# Patient Record
Sex: Female | Born: 1985 | Race: Black or African American | Hispanic: No | Marital: Single | Smoking: Never smoker
Health system: Southern US, Community
[De-identification: ages and names within clinical notes are randomized; demographics above are authoritative.]

## PROBLEM LIST (undated history)

## (undated) DIAGNOSIS — M1990 Unspecified osteoarthritis, unspecified site: Secondary | ICD-10-CM

---

## 2016-02-10 ENCOUNTER — Emergency Department (HOSPITAL_COMMUNITY): Payer: Self-pay

## 2016-02-10 ENCOUNTER — Encounter (HOSPITAL_COMMUNITY): Payer: Self-pay | Admitting: Oncology

## 2016-02-10 DIAGNOSIS — F1290 Cannabis use, unspecified, uncomplicated: Secondary | ICD-10-CM | POA: Insufficient documentation

## 2016-02-10 DIAGNOSIS — M25462 Effusion, left knee: Secondary | ICD-10-CM | POA: Insufficient documentation

## 2016-02-10 DIAGNOSIS — Z791 Long term (current) use of non-steroidal anti-inflammatories (NSAID): Secondary | ICD-10-CM | POA: Insufficient documentation

## 2016-02-10 MED ORDER — OXYCODONE-ACETAMINOPHEN 5-325 MG PO TABS
1.0000 | ORAL_TABLET | ORAL | Status: DC | PRN
Start: 2016-02-10 — End: 2016-02-11
  Administered 2016-02-10: 1 via ORAL
  Filled 2016-02-10: qty 1

## 2016-02-10 NOTE — ED Triage Notes (Signed)
Pt c/o left knee pain.  Denies trauma or injury to left knee.  Pt states this has happened in the past.  Rates pain 10/10.  Pt is A&O x 4.

## 2016-02-11 ENCOUNTER — Emergency Department (HOSPITAL_COMMUNITY)
Admission: EM | Admit: 2016-02-11 | Discharge: 2016-02-11 | Disposition: A | Discharge status: Home / Self Care | Payer: Self-pay | Attending: Emergency Medicine | Admitting: Emergency Medicine

## 2016-02-11 DIAGNOSIS — M25562 Pain in left knee: Secondary | ICD-10-CM

## 2016-02-11 DIAGNOSIS — M25462 Effusion, left knee: Secondary | ICD-10-CM

## 2016-02-11 HISTORY — DX: Unspecified osteoarthritis, unspecified site: M19.90

## 2016-02-11 LAB — SYNOVIAL CELL COUNT + DIFF, W/ CRYSTALS
CRYSTALS FLUID: NONE SEEN
Lymphocytes-Synovial Fld: 10 % (ref 0–20)
Monocyte-Macrophage-Synovial Fluid: 3 % — ABNORMAL LOW (ref 50–90)
NEUTROPHIL, SYNOVIAL: 87 % — AB (ref 0–25)
WBC, Synovial: 20400 /mm3 — ABNORMAL HIGH (ref 0–200)

## 2016-02-11 LAB — GRAM STAIN

## 2016-02-11 MED ORDER — LIDOCAINE-EPINEPHRINE (PF) 2 %-1:200000 IJ SOLN
20.0000 mL | Freq: Once | INTRAMUSCULAR | Status: AC
  Administered 2016-02-11: 20 mL via INTRADERMAL

## 2016-02-11 MED ORDER — LIDOCAINE-EPINEPHRINE (PF) 1 %-1:200000 IJ SOLN
INTRAMUSCULAR | Status: AC
  Filled 2016-02-11: qty 30

## 2016-02-11 MED ORDER — NAPROXEN 500 MG PO TABS: ORAL_TABLET | ORAL | 0 refills | Status: AC

## 2016-02-11 MED ORDER — NAPROXEN 500 MG PO TABS
500.0000 mg | ORAL_TABLET | Freq: Once | ORAL | Status: AC
  Administered 2016-02-11: 500 mg via ORAL
  Filled 2016-02-11: qty 1

## 2016-02-11 NOTE — ED Notes (Signed)
Patient is alert and oriented x3.  She was given DC instructions and follow up visit instructions.  Patient gave verbal understanding. She was DC ambulatory under her own power to home.  V/S stable.  He was not showing any signs of distress on DC 

## 2016-02-11 NOTE — ED Provider Notes (Signed)
[facility]-EMERGENCY DEPT Provider Note: Kristina Santana. Lane Gaelen Brager, MD, FACEP  CSN: [id number] MRN: [id number] ARRIVAL: 02/10/16 at 2244  By signing my name below, I, Kristina Santana, attest that this documentation has been prepared under the direction and in the presence of Paula LibraJohn Valeen Borys, MD. Electronically Signed: Bridgette HabermannMaria Santana, ED Scribe. 02/11/16. 12:49 AM. CHIEF COMPLAINT  Knee Pain   HISTORY OF PRESENT ILLNESS  HPI Comments: Kristina Santana is a 30 y.o. female with h/o arthritis who presents to the Emergency Department complaining of 10/10, aching and throbbing left knee pain onset 3 days ago with associated swelling but no redness or warmth. Pt denies any trauma or injury to her knee. Pain is exacerbated with movement. No alleviating factors noted. Pt states she had the same symptoms 4 months ago which resolved on its own. Pt denies fever.   Past Medical History:  Diagnosis Date  . Arthritis     History reviewed. No pertinent surgical history.  No family history on file.  Social History  Substance Use Topics  . Smoking status: Never Smoker  . Smokeless tobacco: Never Used  . Alcohol use Yes     Comment: socially    Prior to Admission medications   Medication Sig Start Date End Date Taking? Authorizing Provider  naproxen (NAPROSYN) 500 MG tablet Take one tablet every 12 hours as needed for knee pain. Best taken with a meal. 02/11/16   Paula LibraJohn Corbyn Steedman, MD    Allergies Review of patient's allergies indicates no known allergies.   REVIEW OF SYSTEMS  Negative except as noted here or in the History of Present Illness.   PHYSICAL EXAMINATION  Initial Vital Signs Blood pressure 140/90, pulse 116, temperature 98.6 F (37 C), temperature source Oral, resp. rate 20, height 6\' 2"  (1.88 m), weight 270 lb (122.5 kg), last menstrual period 01/23/2016, SpO2 100 %.  Examination General: Well-developed, well-nourished female in no acute distress; appearance consistent with age of record HENT: normocephalic;  atraumatic Eyes: pupils equal, round and reactive to light; extraocular muscles intact Neck: supple Heart: regular rate and rhythm Lungs: clear to auscultation bilaterally Abdomen: soft; nondistended; nontender Extremities: No deformity; full range of motion except left knee limited by pain; left knee tenderness with effusion but no erythema or warmth; pulses normal Neurologic: Awake, alert and oriented; motor function intact in all extremities and symmetric; no facial droop Skin: Warm and dry Psychiatric: Normal mood and affect   RESULTS  Summary of this visit's results, reviewed by myself:   EKG Interpretation  Date/Time:    Ventricular Rate:    PR Interval:    QRS Duration:   QT Interval:    QTC Calculation:   R Axis:     Text Interpretation:        Laboratory Studies: Results for orders placed or performed during the hospital encounter of 02/11/16 (from the past 24 hour(s))  Body fluid culture     Status: None (Preliminary result)   Collection Time: 02/11/16 12:55 AM  Result Value Ref Range   Specimen Description SYNOVIAL KNEE LEFT    Special Requests NONE    Gram Stain      ABUNDANT WBC PRESENT, PREDOMINANTLY PMN NO ORGANISMS SEEN Gram Stain Report Called to,Read Back By and Verified With: T.LEONARD,RN  AT 0225 02/11/16 BY W.SHEA    Culture PENDING    Report Status PENDING   Synovial cell count + diff, w/ crystals     Status: Abnormal   Collection Time: 02/11/16 12:55 AM  Result Value Ref  Range   Color, Synovial YELLOW YELLOW   Appearance-Synovial TURBID (A) CLEAR   Crystals, Fluid NO CRYSTALS SEEN    WBC, Synovial 20,400 (H) 0 - 200 /cu mm   Neutrophil, Synovial 87 (H) 0 - 25 %   Lymphocytes-Synovial Fld 10 0 - 20 %   Monocyte-Macrophage-Synovial Fluid 3 (L) 50 - 90 %   Imaging Studies: Dg Knee Complete 4 Views Left  Result Date: 02/11/2016 CLINICAL DATA:  Acute onset of generalized left knee pain. Initial encounter. EXAM: LEFT KNEE - COMPLETE 4+ VIEW  COMPARISON:  None. FINDINGS: There is no evidence of fracture or dislocation. The joint spaces are preserved. No significant degenerative change is seen; the patellofemoral joint is grossly unremarkable in appearance. A large knee joint effusion is noted. The soft tissues are otherwise grossly unremarkable in appearance. IMPRESSION: 1. No evidence of fracture or dislocation. 2. Large knee joint effusion noted. MRI could be considered for further evaluation, to assess for internal derangement. Electronically Signed   By: Roanna Raider M.D.   On: 02/11/2016 00:10    ED COURSE  Nursing notes and initial vitals signs, including pulse oximetry, reviewed.  Vitals:   02/10/16 2314 02/10/16 2317 02/11/16 0305  BP: 140/90  126/72  Pulse: 116  74  Resp: 20  20  Temp: 98.6 F (37 C)    TempSrc: Oral    SpO2: 100%  100%  Weight:  270 lb (122.5 kg)   Height:  6\' 2"  (1.88 m)    3:07 AM Patient's pain is significantly improved. She was able to ambulate to the bathroom with minimal pain. We will start on an anti-inflammatory and have her follow-up with orthopedics if symptoms persist. Symptoms and synovial fluid white count are not consistent with a septic joint but fluid has been sent for culture.  PROCEDURES   ARTHROCENTESIS Skin overlying the medial aspect of the left knee joint was anesthetized with about 1.5 milliliters of 2% lidocaine with epinephrine. The knee was then prepped and draped in the usual sterile fashion. An 18-gauge needle was entered into the left knee joint and approximately 60 milliliters of grossly cloudy, straw-colored synovial fluid were obtained. The patient tolerated this well and there were no immediate complications. Fluid was sent to the lab for analysis.  ED DIAGNOSES     ICD-9-CM ICD-10-CM   1. Acute pain of left knee 719.46 M25.562   2. Effusion of left knee 719.06 M25.462     I personally performed the services described in this documentation, which was scribed in  my presence. The recorded information has been reviewed and is accurate.     Paula Libra, MD 02/11/16 [phone]

## 2016-02-16 LAB — CULTURE, BODY FLUID W GRAM STAIN -BOTTLE

## 2016-02-16 LAB — CULTURE, BODY FLUID-BOTTLE: CULTURE: NO GROWTH

## 2018-06-13 IMAGING — CR DG KNEE COMPLETE 4+V*L*
4 series · 4 of 4 positions shown · non-contrast
Comparison: None.

CLINICAL DATA: Acute onset of generalized left knee pain. Initial
encounter.

EXAM:
LEFT KNEE - COMPLETE 4+ VIEW

[t knee ap left]
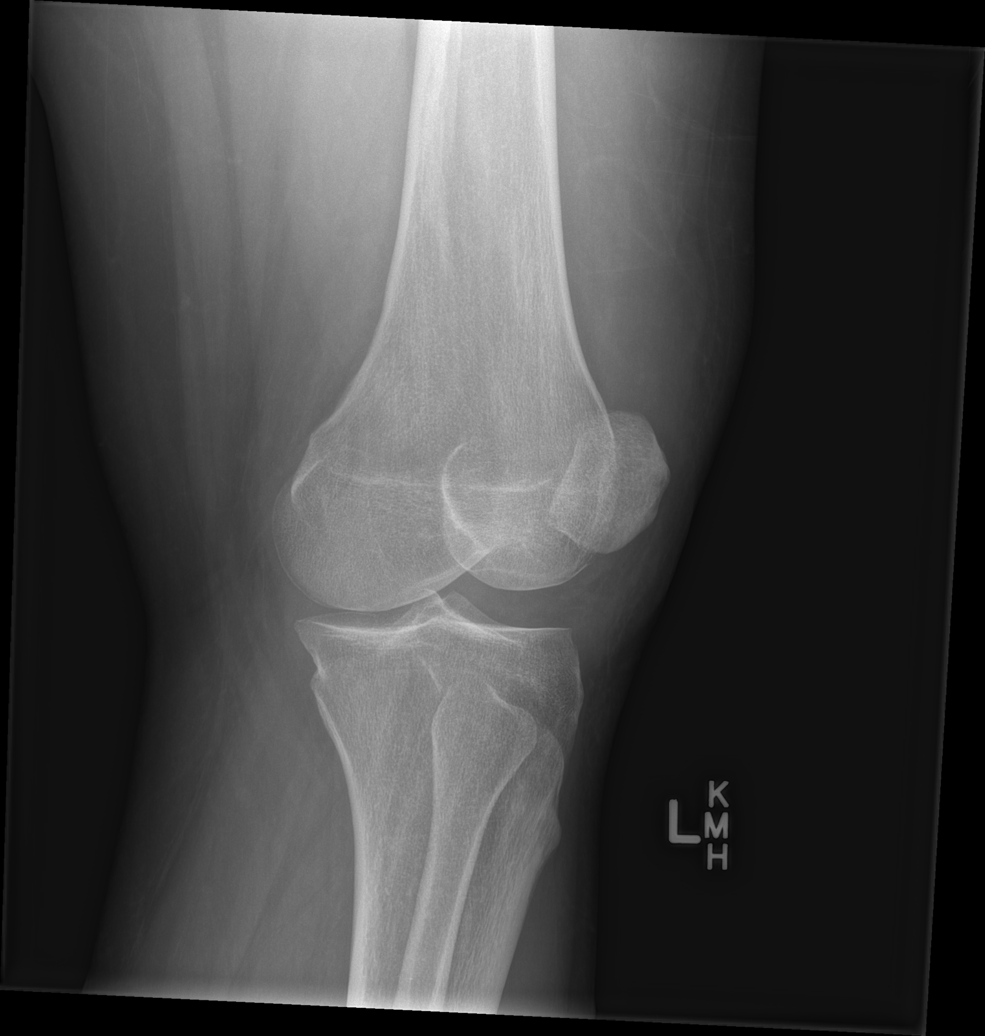

[t knee obl left (1 of 2)]
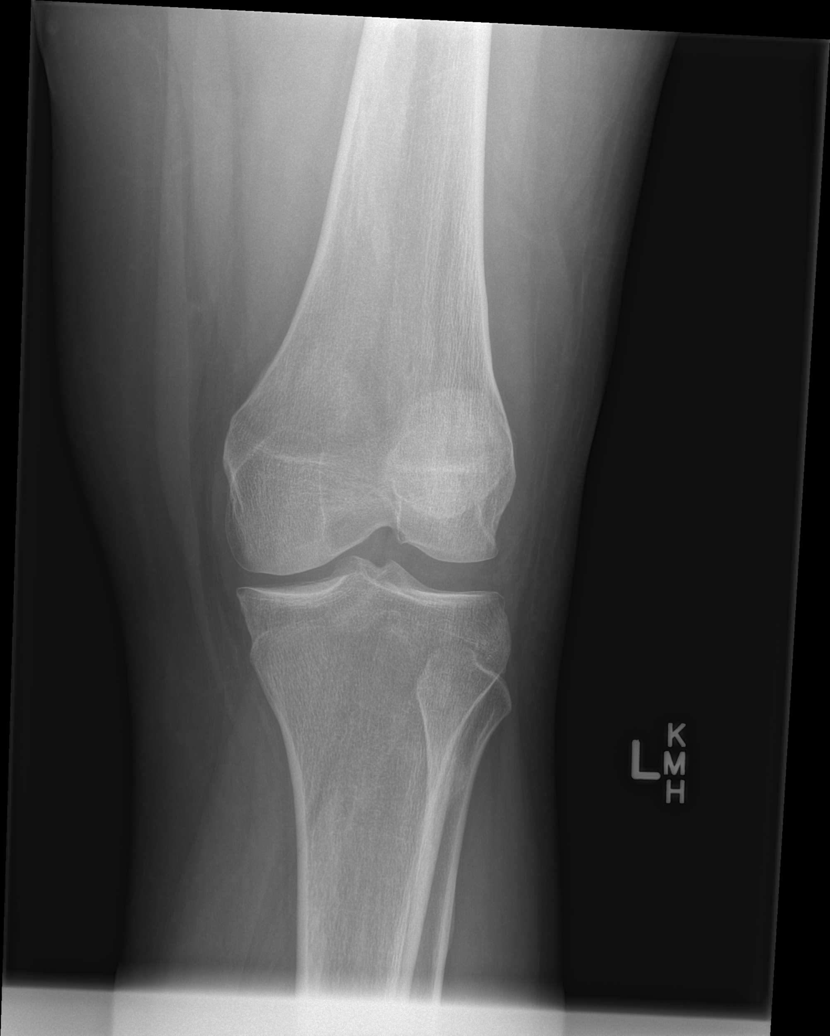

[t knee obl left (2 of 2)]
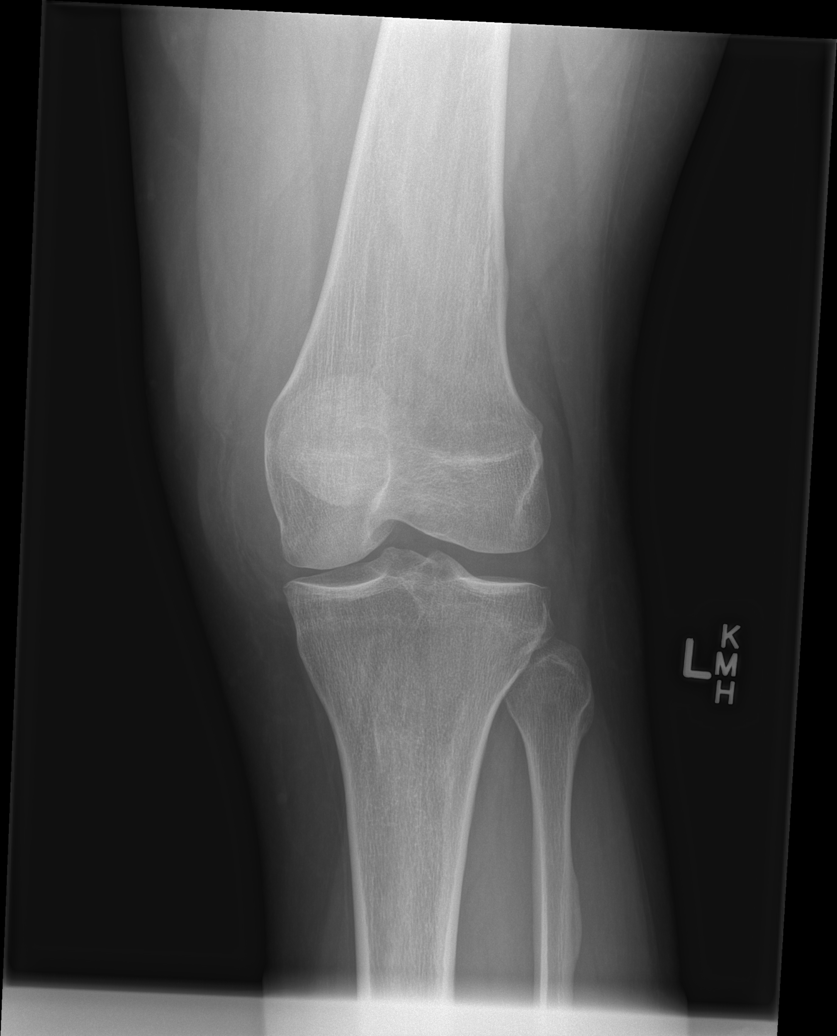

[t knee lat left]
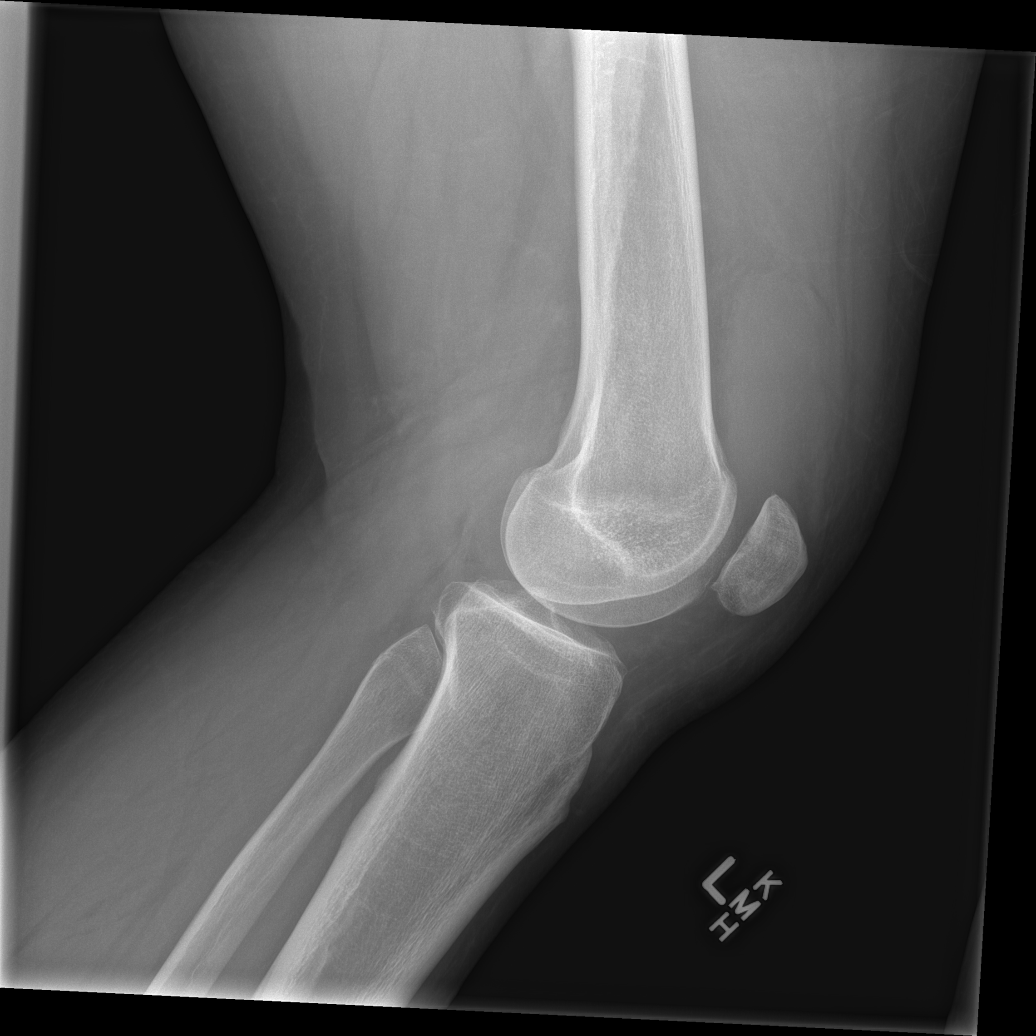

[4 of 4 positions shown; findings below may reference images not displayed]

FINDINGS: There is no evidence of fracture or dislocation. The joint spaces
are preserved. No significant degenerative change is seen; the
patellofemoral joint is grossly unremarkable in appearance.

A large knee joint effusion is noted. The soft tissues are otherwise
grossly unremarkable in appearance.
IMPRESSION: 1. No evidence of fracture or dislocation.
2. Large knee joint effusion noted. MRI could be considered for
further evaluation, to assess for internal derangement.
